# Patient Record
Sex: Female | Born: 1955 | Race: White | Hispanic: No | Marital: Married
Health system: Southern US, Community
[De-identification: ages and names within clinical notes are randomized; demographics above are authoritative.]

## PROBLEM LIST (undated history)

## (undated) HISTORY — PX: BREAST BIOPSY: SHX20

---

## 1997-09-18 ENCOUNTER — Other Ambulatory Visit: Admission: RE | Admit: 1997-09-18 | Discharge: 1997-09-18 | Payer: Self-pay | Admitting: Obstetrics & Gynecology

## 1998-09-21 ENCOUNTER — Other Ambulatory Visit: Admission: RE | Admit: 1998-09-21 | Discharge: 1998-09-21 | Payer: Self-pay | Admitting: Family Medicine

## 1999-10-21 ENCOUNTER — Other Ambulatory Visit: Admission: RE | Admit: 1999-10-21 | Discharge: 1999-10-21 | Payer: Self-pay | Admitting: Obstetrics & Gynecology

## 2000-10-02 ENCOUNTER — Other Ambulatory Visit: Admission: RE | Admit: 2000-10-02 | Discharge: 2000-10-02 | Payer: Self-pay | Admitting: Obstetrics & Gynecology

## 2000-10-03 ENCOUNTER — Encounter (INDEPENDENT_AMBULATORY_CARE_PROVIDER_SITE_OTHER): Payer: Self-pay | Admitting: Specialist

## 2000-10-03 ENCOUNTER — Other Ambulatory Visit: Admission: RE | Admit: 2000-10-03 | Discharge: 2000-10-03 | Payer: Self-pay | Admitting: Obstetrics & Gynecology

## 2001-11-08 ENCOUNTER — Other Ambulatory Visit: Admission: RE | Admit: 2001-11-08 | Discharge: 2001-11-08 | Payer: Self-pay | Admitting: Obstetrics & Gynecology

## 2002-12-03 ENCOUNTER — Other Ambulatory Visit: Admission: RE | Admit: 2002-12-03 | Discharge: 2002-12-03 | Payer: Self-pay | Admitting: Obstetrics & Gynecology

## 2003-12-09 ENCOUNTER — Other Ambulatory Visit: Admission: RE | Admit: 2003-12-09 | Discharge: 2003-12-09 | Payer: Self-pay | Admitting: Obstetrics & Gynecology

## 2005-01-06 ENCOUNTER — Other Ambulatory Visit: Admission: RE | Admit: 2005-01-06 | Discharge: 2005-01-06 | Payer: Self-pay | Admitting: Obstetrics & Gynecology

## 2007-12-13 ENCOUNTER — Encounter: Admission: RE | Admit: 2007-12-13 | Discharge: 2007-12-13 | Payer: Self-pay | Admitting: Obstetrics & Gynecology

## 2009-01-28 ENCOUNTER — Encounter: Admission: RE | Admit: 2009-01-28 | Discharge: 2009-01-28 | Payer: Self-pay | Admitting: Obstetrics & Gynecology

## 2010-02-21 ENCOUNTER — Encounter: Admission: RE | Admit: 2010-02-21 | Discharge: 2010-02-21 | Payer: Self-pay | Admitting: Obstetrics & Gynecology

## 2011-04-10 ENCOUNTER — Other Ambulatory Visit: Payer: Self-pay | Admitting: Obstetrics & Gynecology

## 2011-04-10 DIAGNOSIS — Z1231 Encounter for screening mammogram for malignant neoplasm of breast: Secondary | ICD-10-CM

## 2011-05-03 ENCOUNTER — Ambulatory Visit
Admission: RE | Admit: 2011-05-03 | Discharge: 2011-05-03 | Disposition: A | Discharge status: Home / Self Care | Payer: 59 | Source: Ambulatory Visit | Attending: Obstetrics & Gynecology | Admitting: Obstetrics & Gynecology

## 2011-05-03 DIAGNOSIS — Z1231 Encounter for screening mammogram for malignant neoplasm of breast: Secondary | ICD-10-CM

## 2012-09-16 ENCOUNTER — Other Ambulatory Visit: Payer: Self-pay

## 2012-09-16 DIAGNOSIS — Z1231 Encounter for screening mammogram for malignant neoplasm of breast: Secondary | ICD-10-CM

## 2012-09-17 ENCOUNTER — Ambulatory Visit
Admission: RE | Admit: 2012-09-17 | Discharge: 2012-09-17 | Disposition: A | Discharge status: Home / Self Care | Payer: Commercial Managed Care - PPO | Source: Ambulatory Visit

## 2012-09-17 DIAGNOSIS — Z1231 Encounter for screening mammogram for malignant neoplasm of breast: Secondary | ICD-10-CM

## 2013-10-28 ENCOUNTER — Other Ambulatory Visit: Payer: Self-pay

## 2013-10-28 DIAGNOSIS — Z1231 Encounter for screening mammogram for malignant neoplasm of breast: Secondary | ICD-10-CM

## 2013-11-07 ENCOUNTER — Ambulatory Visit
Admission: RE | Admit: 2013-11-07 | Discharge: 2013-11-07 | Disposition: A | Discharge status: Home / Self Care | Payer: BC Managed Care – PPO | Source: Ambulatory Visit

## 2013-11-07 ENCOUNTER — Encounter (INDEPENDENT_AMBULATORY_CARE_PROVIDER_SITE_OTHER): Payer: Self-pay

## 2013-11-07 DIAGNOSIS — Z1231 Encounter for screening mammogram for malignant neoplasm of breast: Secondary | ICD-10-CM

## 2013-11-10 ENCOUNTER — Other Ambulatory Visit: Payer: Self-pay | Admitting: Obstetrics & Gynecology

## 2013-11-10 DIAGNOSIS — R928 Other abnormal and inconclusive findings on diagnostic imaging of breast: Secondary | ICD-10-CM

## 2013-11-14 ENCOUNTER — Ambulatory Visit
Admission: RE | Admit: 2013-11-14 | Discharge: 2013-11-14 | Disposition: A | Discharge status: Home / Self Care | Payer: BC Managed Care – PPO | Source: Ambulatory Visit | Attending: Obstetrics & Gynecology | Admitting: Obstetrics & Gynecology

## 2013-11-14 DIAGNOSIS — R928 Other abnormal and inconclusive findings on diagnostic imaging of breast: Secondary | ICD-10-CM

## 2014-11-18 ENCOUNTER — Other Ambulatory Visit: Payer: Self-pay

## 2014-11-18 DIAGNOSIS — Z1231 Encounter for screening mammogram for malignant neoplasm of breast: Secondary | ICD-10-CM

## 2014-12-04 ENCOUNTER — Ambulatory Visit
Admission: RE | Admit: 2014-12-04 | Discharge: 2014-12-04 | Disposition: A | Discharge status: Home / Self Care | Payer: BC Managed Care – PPO | Source: Ambulatory Visit

## 2014-12-04 DIAGNOSIS — Z1231 Encounter for screening mammogram for malignant neoplasm of breast: Secondary | ICD-10-CM

## 2015-11-09 ENCOUNTER — Other Ambulatory Visit: Payer: Self-pay | Admitting: Obstetrics & Gynecology

## 2015-11-09 DIAGNOSIS — Z1231 Encounter for screening mammogram for malignant neoplasm of breast: Secondary | ICD-10-CM

## 2015-12-06 ENCOUNTER — Ambulatory Visit
Admission: RE | Admit: 2015-12-06 | Discharge: 2015-12-06 | Disposition: A | Discharge status: Home / Self Care | Payer: BC Managed Care – PPO | Source: Ambulatory Visit | Attending: Obstetrics & Gynecology | Admitting: Obstetrics & Gynecology

## 2015-12-06 DIAGNOSIS — Z1231 Encounter for screening mammogram for malignant neoplasm of breast: Secondary | ICD-10-CM

## 2016-12-27 ENCOUNTER — Other Ambulatory Visit: Payer: Self-pay | Admitting: Obstetrics & Gynecology

## 2016-12-27 DIAGNOSIS — Z1231 Encounter for screening mammogram for malignant neoplasm of breast: Secondary | ICD-10-CM

## 2016-12-29 ENCOUNTER — Ambulatory Visit
Admission: RE | Admit: 2016-12-29 | Discharge: 2016-12-29 | Disposition: A | Discharge status: Home / Self Care | Payer: BC Managed Care – PPO | Source: Ambulatory Visit | Attending: Obstetrics & Gynecology | Admitting: Obstetrics & Gynecology

## 2016-12-29 DIAGNOSIS — Z1231 Encounter for screening mammogram for malignant neoplasm of breast: Secondary | ICD-10-CM

## 2017-12-12 ENCOUNTER — Other Ambulatory Visit: Payer: Self-pay | Admitting: Obstetrics & Gynecology

## 2017-12-12 DIAGNOSIS — Z1231 Encounter for screening mammogram for malignant neoplasm of breast: Secondary | ICD-10-CM

## 2018-01-01 ENCOUNTER — Ambulatory Visit
Admission: RE | Admit: 2018-01-01 | Discharge: 2018-01-01 | Disposition: A | Discharge status: Home / Self Care | Payer: BC Managed Care – PPO | Source: Ambulatory Visit | Attending: Obstetrics & Gynecology | Admitting: Obstetrics & Gynecology

## 2018-01-01 DIAGNOSIS — Z1231 Encounter for screening mammogram for malignant neoplasm of breast: Secondary | ICD-10-CM

## 2019-01-28 ENCOUNTER — Other Ambulatory Visit: Payer: Self-pay | Admitting: Family Medicine

## 2019-01-28 DIAGNOSIS — Z1231 Encounter for screening mammogram for malignant neoplasm of breast: Secondary | ICD-10-CM

## 2019-03-14 ENCOUNTER — Ambulatory Visit
Admission: RE | Admit: 2019-03-14 | Discharge: 2019-03-14 | Disposition: A | Discharge status: Home / Self Care | Payer: BC Managed Care – PPO | Source: Ambulatory Visit | Attending: Family Medicine | Admitting: Family Medicine

## 2019-03-14 ENCOUNTER — Other Ambulatory Visit: Payer: Self-pay

## 2019-03-14 DIAGNOSIS — Z1231 Encounter for screening mammogram for malignant neoplasm of breast: Secondary | ICD-10-CM

## 2019-03-18 ENCOUNTER — Other Ambulatory Visit: Payer: Self-pay | Admitting: Family Medicine

## 2019-03-18 DIAGNOSIS — R928 Other abnormal and inconclusive findings on diagnostic imaging of breast: Secondary | ICD-10-CM

## 2019-03-19 ENCOUNTER — Ambulatory Visit
Admission: RE | Admit: 2019-03-19 | Discharge: 2019-03-19 | Disposition: A | Discharge status: Home / Self Care | Payer: BC Managed Care – PPO | Source: Ambulatory Visit | Attending: Family Medicine | Admitting: Family Medicine

## 2019-03-19 ENCOUNTER — Other Ambulatory Visit: Payer: Self-pay

## 2019-03-19 DIAGNOSIS — R928 Other abnormal and inconclusive findings on diagnostic imaging of breast: Secondary | ICD-10-CM

## 2020-03-18 ENCOUNTER — Other Ambulatory Visit: Payer: Self-pay | Admitting: Family Medicine

## 2020-03-18 DIAGNOSIS — Z1231 Encounter for screening mammogram for malignant neoplasm of breast: Secondary | ICD-10-CM

## 2020-03-19 ENCOUNTER — Ambulatory Visit
Admission: RE | Admit: 2020-03-19 | Discharge: 2020-03-19 | Disposition: A | Discharge status: Home / Self Care | Payer: BC Managed Care – PPO | Source: Ambulatory Visit | Attending: Family Medicine | Admitting: Family Medicine

## 2020-03-19 ENCOUNTER — Other Ambulatory Visit: Payer: Self-pay

## 2020-03-19 DIAGNOSIS — Z1231 Encounter for screening mammogram for malignant neoplasm of breast: Secondary | ICD-10-CM

## 2020-12-17 ENCOUNTER — Ambulatory Visit
Admission: RE | Admit: 2020-12-17 | Discharge: 2020-12-17 | Disposition: A | Discharge status: Home / Self Care | Payer: BC Managed Care – PPO | Source: Ambulatory Visit | Attending: Family Medicine | Admitting: Family Medicine

## 2020-12-17 ENCOUNTER — Other Ambulatory Visit: Payer: Self-pay

## 2020-12-17 DIAGNOSIS — Z1231 Encounter for screening mammogram for malignant neoplasm of breast: Secondary | ICD-10-CM

## 2021-11-08 ENCOUNTER — Other Ambulatory Visit: Payer: Self-pay | Admitting: Family Medicine

## 2021-11-08 DIAGNOSIS — Z1231 Encounter for screening mammogram for malignant neoplasm of breast: Secondary | ICD-10-CM

## 2021-12-19 ENCOUNTER — Ambulatory Visit
Admission: RE | Admit: 2021-12-19 | Discharge: 2021-12-19 | Disposition: A | Discharge status: Home / Self Care | Payer: Medicare PPO | Source: Ambulatory Visit | Attending: Family Medicine | Admitting: Family Medicine

## 2021-12-19 DIAGNOSIS — Z1231 Encounter for screening mammogram for malignant neoplasm of breast: Secondary | ICD-10-CM

## 2023-01-19 ENCOUNTER — Other Ambulatory Visit: Payer: Self-pay | Admitting: Family Medicine

## 2023-01-19 DIAGNOSIS — Z1231 Encounter for screening mammogram for malignant neoplasm of breast: Secondary | ICD-10-CM

## 2023-02-01 ENCOUNTER — Ambulatory Visit
Admission: RE | Admit: 2023-02-01 | Discharge: 2023-02-01 | Disposition: A | Discharge status: Home / Self Care | Payer: Medicare PPO | Source: Ambulatory Visit | Attending: Family Medicine | Admitting: Family Medicine

## 2023-02-01 DIAGNOSIS — Z1231 Encounter for screening mammogram for malignant neoplasm of breast: Secondary | ICD-10-CM

## 2023-04-05 IMAGING — MG MM DIGITAL SCREENING BILAT W/ TOMO AND CAD
8 series · 9 of 24 positions shown · non-contrast
Comparison: Previous exam(s).

CLINICAL DATA: Screening.

EXAM:
DIGITAL SCREENING BILATERAL MAMMOGRAM WITH TOMOSYNTHESIS AND CAD
TECHNIQUE: Bilateral screening digital craniocaudal and mediolateral oblique
mammograms were obtained. Bilateral screening digital breast
tomosynthesis was performed. The images were evaluated with
computer-aided detection.

[R CC synth-2D]
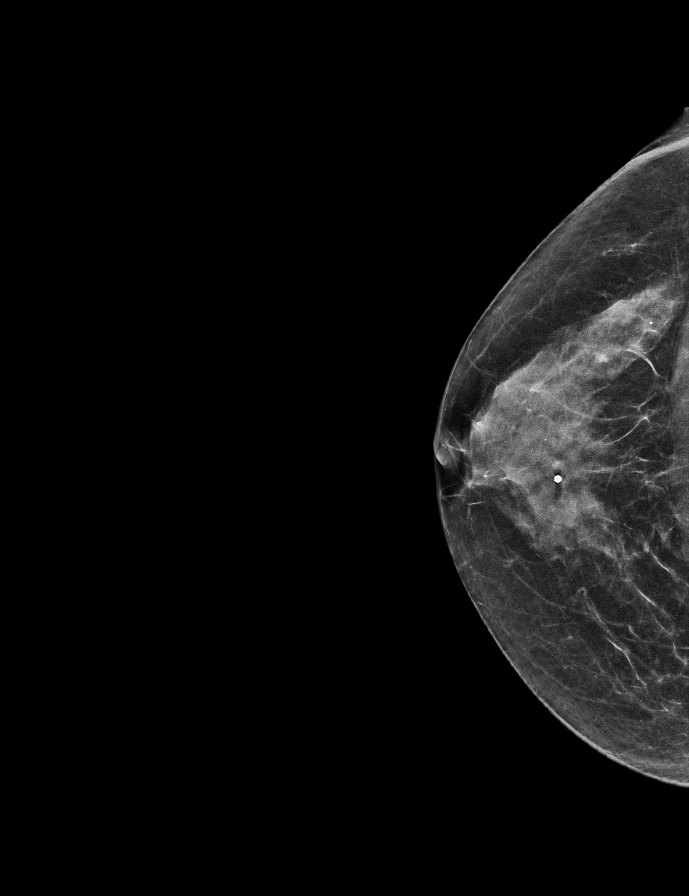

[R MLO synth-2D]
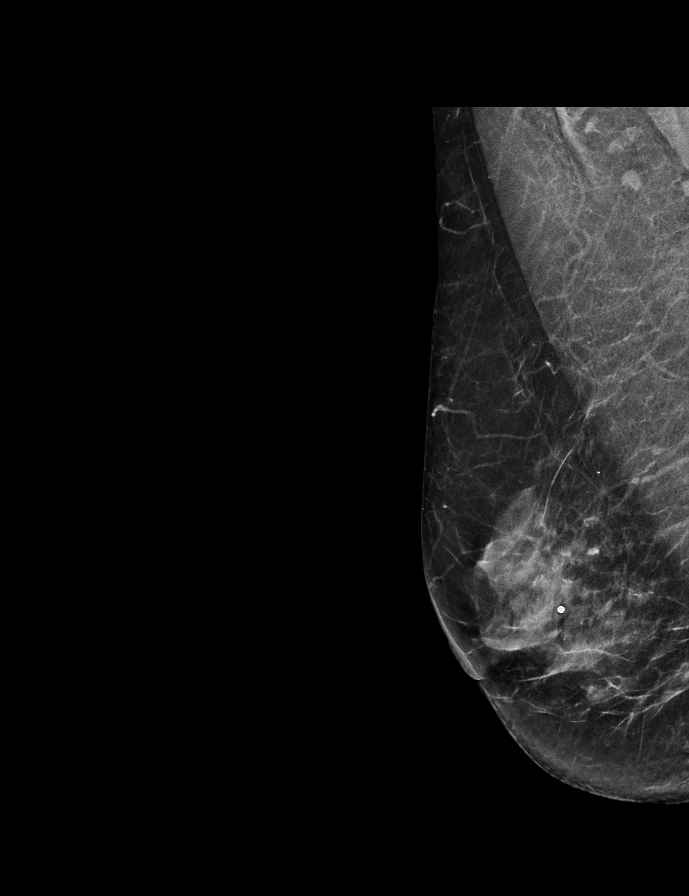

[L CC synth-2D]
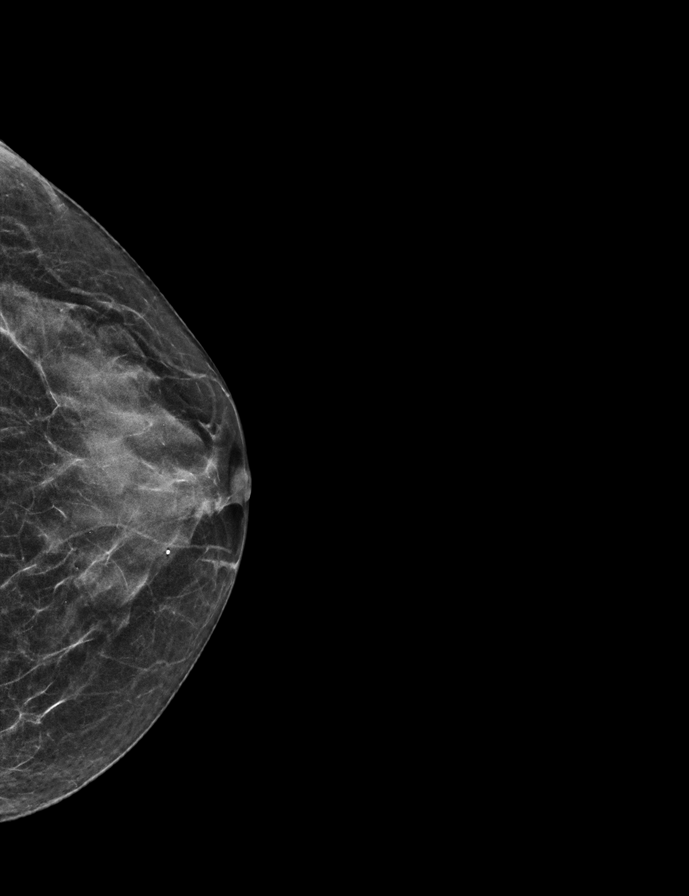

[L MLO synth-2D]
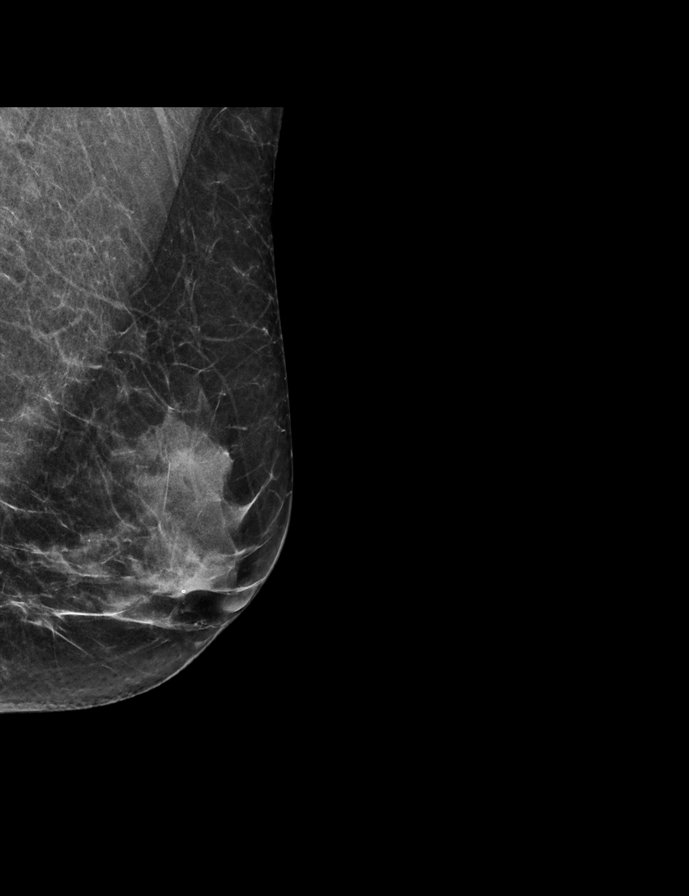

[L MLO tomo · 2 of 50 frames shown]
[frame 17/50]
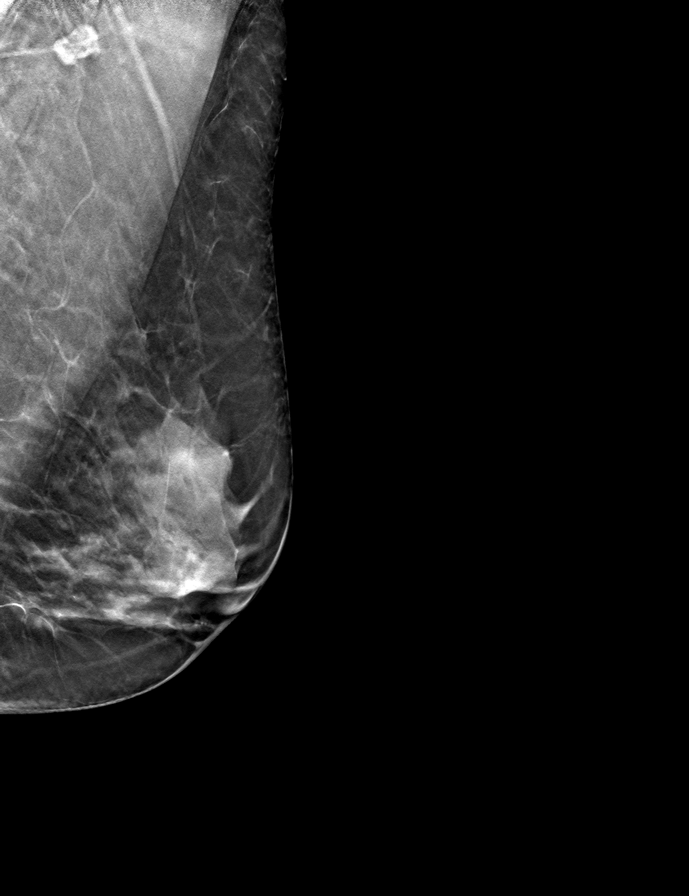
[frame 25/50]
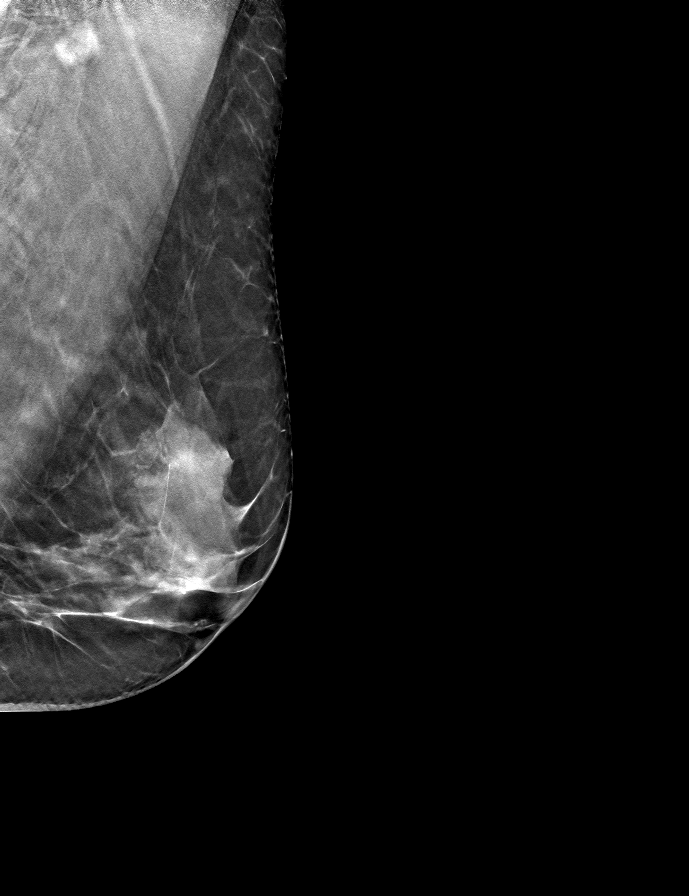

[L CC tomo · tomo slice 24/47.0]
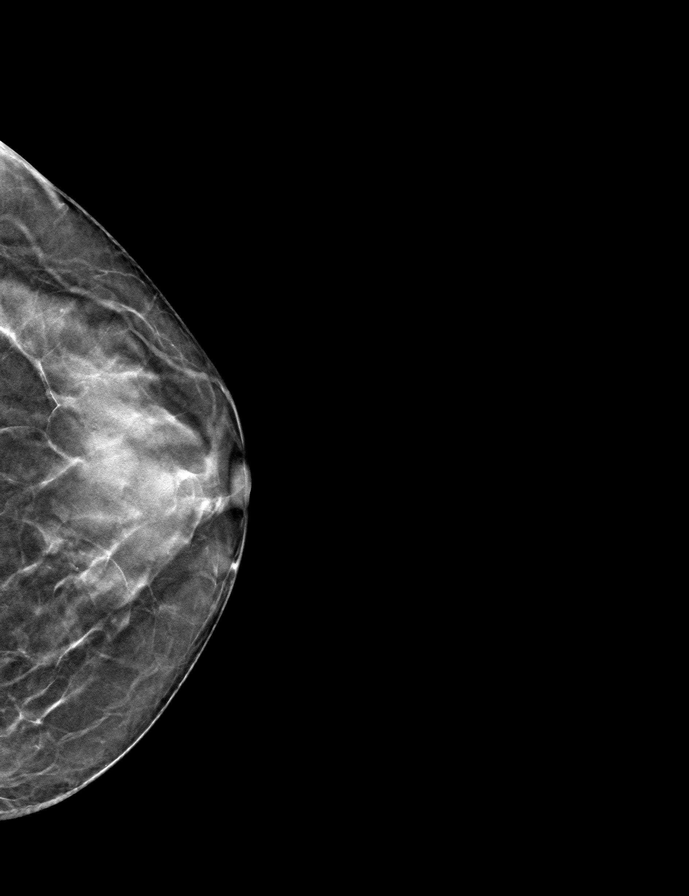

[R CC tomo · tomo slice 27/52.0]
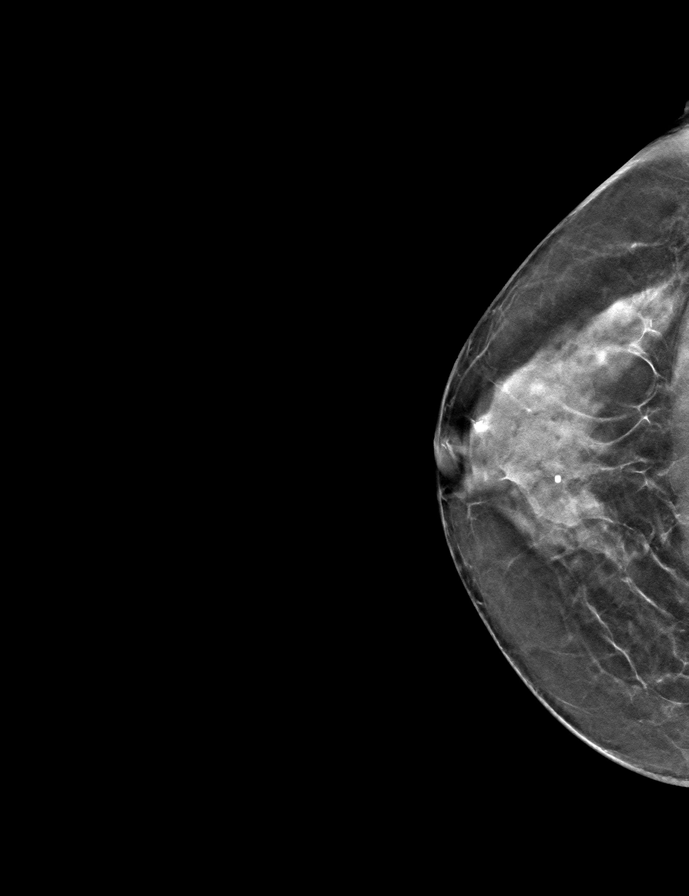

[R MLO tomo · tomo slice 28/55.0]
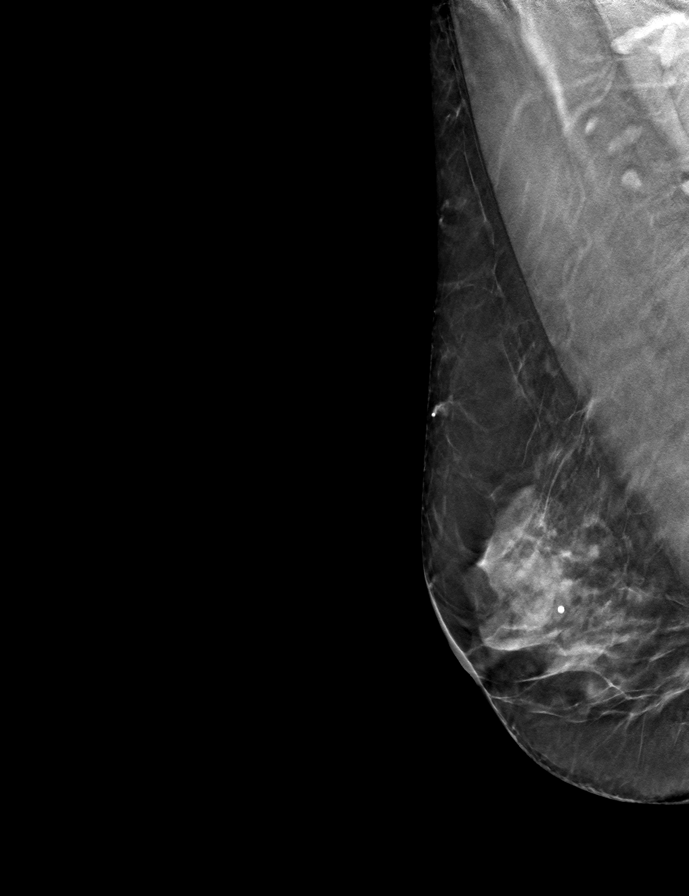

[9 of 24 positions shown; findings below may reference images not displayed]

ACR Breast Density Category c: The breast tissue is heterogeneously
dense, which may obscure small masses.
FINDINGS: There are no findings suspicious for malignancy.
IMPRESSION: No mammographic evidence of malignancy. A result letter of this
screening mammogram will be mailed directly to the patient.

RECOMMENDATION:
Screening mammogram in one year. (Code:Q3-W-BC3)

BI-RADS CATEGORY  1: Negative.

## 2024-01-21 ENCOUNTER — Other Ambulatory Visit: Payer: Self-pay | Admitting: Family Medicine

## 2024-01-21 DIAGNOSIS — Z1231 Encounter for screening mammogram for malignant neoplasm of breast: Secondary | ICD-10-CM

## 2024-02-07 ENCOUNTER — Other Ambulatory Visit: Payer: Self-pay | Admitting: Obstetrics and Gynecology

## 2024-02-07 ENCOUNTER — Ambulatory Visit
Admission: RE | Admit: 2024-02-07 | Discharge: 2024-02-07 | Disposition: A | Discharge status: Home / Self Care | Source: Ambulatory Visit | Attending: Family Medicine | Admitting: Family Medicine

## 2024-02-07 DIAGNOSIS — Z1231 Encounter for screening mammogram for malignant neoplasm of breast: Secondary | ICD-10-CM

## 2024-03-13 ENCOUNTER — Other Ambulatory Visit: Payer: Self-pay
